# Patient Record
Sex: Male | Born: 1970 | Race: Black or African American | Hispanic: No | Smoking: Former smoker
Health system: Southern US, Community
[De-identification: ages and names within clinical notes are randomized; demographics above are authoritative.]

## PROBLEM LIST (undated history)

## (undated) DIAGNOSIS — M109 Gout, unspecified: Secondary | ICD-10-CM

## (undated) DIAGNOSIS — I10 Essential (primary) hypertension: Secondary | ICD-10-CM

## (undated) DIAGNOSIS — N200 Calculus of kidney: Secondary | ICD-10-CM

## (undated) HISTORY — PX: ANKLE FUSION: SHX881

## (undated) HISTORY — PX: TONSILLECTOMY: SUR1361

---

## 2004-02-28 ENCOUNTER — Other Ambulatory Visit: Payer: Self-pay

## 2005-10-30 ENCOUNTER — Ambulatory Visit: Payer: Self-pay

## 2009-10-02 ENCOUNTER — Emergency Department: Payer: Self-pay

## 2012-12-28 ENCOUNTER — Emergency Department: Payer: Self-pay | Admitting: Emergency Medicine

## 2012-12-28 LAB — CBC
HCT: 46.3 % (ref 40.0–52.0)
MCH: 25.3 pg — ABNORMAL LOW (ref 26.0–34.0)
MCHC: 32.5 g/dL (ref 32.0–36.0)
MCV: 78 fL — ABNORMAL LOW (ref 80–100)
Platelet: 291 10*3/uL (ref 150–440)
RBC: 5.93 10*6/uL — ABNORMAL HIGH (ref 4.40–5.90)
RDW: 14.7 % — ABNORMAL HIGH (ref 11.5–14.5)
WBC: 12.8 10*3/uL — ABNORMAL HIGH (ref 3.8–10.6)

## 2012-12-28 LAB — URINALYSIS, COMPLETE
Bacteria: NONE SEEN
Bilirubin,UR: NEGATIVE
Protein: 30
RBC,UR: 184 /HPF (ref 0–5)
Squamous Epithelial: <1
WBC UR: 14 /HPF (ref 0–5)

## 2012-12-28 LAB — BASIC METABOLIC PANEL
Anion Gap: 7 (ref 7–16)
Chloride: 106 mmol/L (ref 98–107)
EGFR (Non-African Amer.): >60
Glucose: 106 mg/dL — ABNORMAL HIGH (ref 65–99)
Osmolality: 276 (ref 275–301)
Potassium: 4 mmol/L (ref 3.5–5.1)
Sodium: 138 mmol/L (ref 136–145)

## 2013-05-16 ENCOUNTER — Emergency Department: Payer: Self-pay | Admitting: Emergency Medicine

## 2013-06-25 ENCOUNTER — Emergency Department: Payer: Self-pay | Admitting: Emergency Medicine

## 2013-06-25 LAB — BASIC METABOLIC PANEL
Anion Gap: 4 — ABNORMAL LOW (ref 7–16)
BUN: 11 mg/dL (ref 7–18)
Calcium, Total: 9.1 mg/dL (ref 8.5–10.1)
Chloride: 104 mmol/L (ref 98–107)
EGFR (African American): >60
EGFR (Non-African Amer.): >60
Glucose: 97 mg/dL (ref 65–99)
Osmolality: 275 (ref 275–301)
Potassium: 3.8 mmol/L (ref 3.5–5.1)

## 2013-06-25 LAB — CBC WITH DIFFERENTIAL/PLATELET
Basophil %: 1.5 %
Eosinophil #: 0.1 10*3/uL (ref 0.0–0.7)
Eosinophil %: 0.7 %
HGB: 15.3 g/dL (ref 13.0–18.0)
Lymphocyte #: 1.9 10*3/uL (ref 1.0–3.6)
Lymphocyte %: 16.7 %
MCH: 25.9 pg — ABNORMAL LOW (ref 26.0–34.0)
MCHC: 33.2 g/dL (ref 32.0–36.0)
Monocyte #: 1 x10 3/mm (ref 0.2–1.0)
Monocyte %: 8.8 %
Neutrophil #: 8.2 10*3/uL — ABNORMAL HIGH (ref 1.4–6.5)
RBC: 5.92 10*6/uL — ABNORMAL HIGH (ref 4.40–5.90)
WBC: 11.3 10*3/uL — ABNORMAL HIGH (ref 3.8–10.6)

## 2013-06-25 LAB — URIC ACID: Uric Acid: 7.3 mg/dL — ABNORMAL HIGH (ref 3.5–7.2)

## 2014-06-23 ENCOUNTER — Emergency Department: Payer: Self-pay | Admitting: Emergency Medicine

## 2014-06-23 LAB — URINALYSIS, COMPLETE
BACTERIA: NONE SEEN
Bilirubin,UR: NEGATIVE
Blood: NEGATIVE
Glucose,UR: NEGATIVE mg/dL (ref 0–75)
KETONE: NEGATIVE
LEUKOCYTE ESTERASE: NEGATIVE
Nitrite: NEGATIVE
Ph: 6 (ref 4.5–8.0)
Protein: NEGATIVE
RBC,UR: 2 /HPF (ref 0–5)
Specific Gravity: 1.019 (ref 1.003–1.030)
Squamous Epithelial: NONE SEEN

## 2014-06-23 LAB — COMPREHENSIVE METABOLIC PANEL
Albumin: 3.9 g/dL (ref 3.4–5.0)
Alkaline Phosphatase: 90 U/L
Anion Gap: 7 (ref 7–16)
BILIRUBIN TOTAL: 0.3 mg/dL (ref 0.2–1.0)
BUN: 11 mg/dL (ref 7–18)
CREATININE: 0.9 mg/dL (ref 0.60–1.30)
Calcium, Total: 9 mg/dL (ref 8.5–10.1)
Chloride: 107 mmol/L (ref 98–107)
Co2: 27 mmol/L (ref 21–32)
EGFR (African American): >60
EGFR (Non-African Amer.): >60
GLUCOSE: 93 mg/dL (ref 65–99)
Osmolality: 280 (ref 275–301)
Potassium: 4.1 mmol/L (ref 3.5–5.1)
SGOT(AST): 27 U/L (ref 15–37)
SGPT (ALT): 29 U/L
Sodium: 141 mmol/L (ref 136–145)
TOTAL PROTEIN: 7.7 g/dL (ref 6.4–8.2)

## 2014-06-23 LAB — CBC
HCT: 48.5 % (ref 40.0–52.0)
HGB: 15.3 g/dL (ref 13.0–18.0)
MCH: 25.7 pg — AB (ref 26.0–34.0)
MCHC: 31.6 g/dL — ABNORMAL LOW (ref 32.0–36.0)
MCV: 81 fL (ref 80–100)
PLATELETS: 266 10*3/uL (ref 150–440)
RBC: 5.96 10*6/uL — ABNORMAL HIGH (ref 4.40–5.90)
RDW: 14.6 % — AB (ref 11.5–14.5)
WBC: 8.1 10*3/uL (ref 3.8–10.6)

## 2014-06-23 LAB — LIPASE, BLOOD: LIPASE: 212 U/L (ref 73–393)

## 2014-06-23 LAB — TROPONIN I: Troponin-I: <0.02 ng/mL

## 2014-07-13 ENCOUNTER — Ambulatory Visit: Payer: Self-pay | Admitting: Urology

## 2014-08-25 ENCOUNTER — Ambulatory Visit: Payer: Self-pay | Admitting: Urology

## 2015-06-16 ENCOUNTER — Emergency Department
Admission: EM | Admit: 2015-06-16 | Discharge: 2015-06-16 | Disposition: A | Discharge status: Home / Self Care | Payer: Medicare Other | Attending: Emergency Medicine | Admitting: Emergency Medicine

## 2015-06-16 ENCOUNTER — Encounter: Payer: Self-pay | Admitting: Emergency Medicine

## 2015-06-16 DIAGNOSIS — M10031 Idiopathic gout, right wrist: Secondary | ICD-10-CM | POA: Insufficient documentation

## 2015-06-16 DIAGNOSIS — M109 Gout, unspecified: Secondary | ICD-10-CM

## 2015-06-16 DIAGNOSIS — I10 Essential (primary) hypertension: Secondary | ICD-10-CM | POA: Diagnosis not present

## 2015-06-16 DIAGNOSIS — Z87891 Personal history of nicotine dependence: Secondary | ICD-10-CM | POA: Insufficient documentation

## 2015-06-16 DIAGNOSIS — M25531 Pain in right wrist: Secondary | ICD-10-CM | POA: Diagnosis present

## 2015-06-16 HISTORY — DX: Essential (primary) hypertension: I10

## 2015-06-16 HISTORY — DX: Gout, unspecified: M10.9

## 2015-06-16 MED ORDER — PREDNISONE 10 MG PO TABS
ORAL_TABLET | ORAL | Status: DC
Start: 2015-06-16 — End: 2015-08-30

## 2015-06-16 MED ORDER — OXYCODONE-ACETAMINOPHEN 5-325 MG PO TABS: 1.0000 | ORAL_TABLET | ORAL | Status: DC | PRN

## 2015-06-16 MED ORDER — DEXAMETHASONE SODIUM PHOSPHATE 10 MG/ML IJ SOLN
10.0000 mg | Freq: Once | INTRAMUSCULAR | Status: AC
  Administered 2015-06-16: 10 mg via INTRAMUSCULAR
  Filled 2015-06-16: qty 1

## 2015-06-16 MED ORDER — OXYCODONE-ACETAMINOPHEN 5-325 MG PO TABS
2.0000 | ORAL_TABLET | Freq: Once | ORAL | Status: AC
  Administered 2015-06-16: 2 via ORAL
  Filled 2015-06-16: qty 2

## 2015-06-16 NOTE — ED Provider Notes (Signed)
[facility] Emergency Department Provider Note  ____________________________________________  Time seen:  2:39 PM  I have reviewed the triage vital signs and the nursing notes.   HISTORY  Chief Complaint Wrist Pain   HPI Bernard Perez is a 44 y.o. male is here today with complaint of acute gout flareup in his right arm. He states he has a history of gout and has been taking colchicine for his gout for 2 days howeverthis is upset his stomach and he has had to quit. He has had gout in his right third finger multiple times however this is in his wrist this time. He denies any recent injury to his arm and this feels exactly like his gout. Currently his pain is 10 out of 10.  Pain is constant and nonradiating. Also patient is hypertensive in the emergency room but admits he did not take his blood pressure medicine today.   Past Medical History  Diagnosis Date  . Gout   . Hypertension     There are no active problems to display for this patient.   Past Surgical History  Procedure Laterality Date  . Ankle fusion    . Tonsillectomy      No current outpatient prescriptions on file.  Allergies Review of patient's allergies indicates no known allergies.  No family history on file.  Social History History  Substance Use Topics  . Smoking status: Former Games developer  . Smokeless tobacco: Not on file  . Alcohol Use: No    Review of Systems Constitutional: No fever/chills ENT: No sore throat. Cardiovascular: Denies chest pain. Respiratory: Denies shortness of breath. Gastrointestinal: GI upset  positive nausea, no vomiting.  No diarrhea.  No constipation. Genitourinary: Negative for dysuria. Musculoskeletal: Negative for back pain. Skin: Negative for rash. Red and  warm right arm and hand Neurological: Negative for headaches, focal weakness or numbness.  10-point ROS otherwise negative.  ____________________________________________   PHYSICAL  EXAM:  VITAL SIGNS: ED Triage Vitals  Enc Vitals Group     BP 06/16/15 1246 204/115 mmHg     Pulse Rate 06/16/15 1246 64     Resp 06/16/15 1246 18     Temp 06/16/15 1246 98.2 F (36.8 C)     Temp Source 06/16/15 1246 Oral     SpO2 06/16/15 1246 100 %     Weight 06/16/15 1244 265 lb (120.203 kg)     Height 06/16/15 1244 6\' 1"  (1.854 m)     Head Cir --      Peak Flow --      Pain Score 06/16/15 1244 10     Pain Loc --      Pain Edu? --      Excl. in GC? --     Constitutional: Alert and oriented. Well appearing and in no acute distress. Eyes: Conjunctivae are normal. PERRL. EOMI. Head: Atraumatic. Nose: No congestion/rhinnorhea. Mouth/Throat: Mucous membranes are moist.. Neck: No stridor.   Cardiovascular: Normal rate, regular rhythm. Grossly normal heart sounds.  Good peripheral circulation. Respiratory: Normal respiratory effort.  No retractions. Lungs CTAB. Gastrointestinal: Soft and nontender. No distention. Musculoskeletal: Right wrist is moderately tender to palpation, edematous and red. Range of motion is restricted secondary to pain.  No lower extremity tenderness nor edema.  No joint effusions. Neurologic:  Normal speech and language. No gross focal neurologic deficits are appreciated. No gait instability. Skin:  Right wrist and hand as stated above. Psychiatric: Mood and affect are normal. Speech and behavior are normal.  ____________________________________________   LABS (all labs ordered are listed, but only abnormal results are displayed)  Labs Reviewed - No data to display PROCEDURES  Procedure(s) performed: None  Critical Care performed: No  ____________________________________________   INITIAL IMPRESSION / ASSESSMENT AND PLAN / ED COURSE  Pertinent labs & imaging results that were available during my care of the patient were reviewed by me and considered in my medical decision making (see chart for details).  Patient was given Decadron IM along  with Percocet while he was in the emergency room. He was given a sling for elevation. He is started on Sterapred DS pack for 6 days and continued on Percocet. He will follow-up with his doctor if any continued problems with his gout. He will resume his blood pressure medication and take daily. ____________________________________________   FINAL CLINICAL IMPRESSION(S) / ED DIAGNOSES  Final diagnoses:  Acute gouty arthritis      Tommi Rumps, PA-C 06/16/15 1530  Tommi Rumps, PA-C 06/16/15 1537  Darien Ramus, MD 06/16/15 1537

## 2015-06-16 NOTE — ED Notes (Signed)
Pt states gout flare up in his right arm, pt states hx of gout and on gout medications, pt in no distress

## 2015-06-16 NOTE — Discharge Instructions (Signed)
Gout Gout is when your joints become red, sore, and swell (inflamed). This is caused by the buildup of uric acid crystals in the joints. Uric acid is a chemical that is normally in the blood. If the level of uric acid gets too high in the blood, these crystals form in your joints and tissues. Over time, these crystals can form into masses near the joints and tissues. These masses can destroy bone and cause the bone to look misshapen (deformed). HOME CARE   Do not take aspirin for pain.  Only take medicine as told by your doctor.  Rest the joint as much as you can. When in bed, keep sheets and blankets off painful areas.  Keep the sore joints raised (elevated).  Put warm or cold packs on painful joints. Use of warm or cold packs depends on which works best for you.  Use crutches if the painful joint is in your leg.  Drink enough fluids to keep your pee (urine) clear or pale yellow. Limit alcohol, sugary drinks, and drinks with fructose in them.  Follow your diet instructions. Pay careful attention to how much protein you eat. Include fruits, vegetables, whole grains, and fat-free or low-fat milk products in your daily diet. Talk to your doctor or dietitian about the use of coffee, vitamin C, and cherries. These may help lower uric acid levels.  Keep a healthy body weight. GET HELP RIGHT AWAY IF:   You have watery poop (diarrhea), throw up (vomit), or have any side effects from medicines.  You do not feel better in 24 hours, or you are getting worse.  Your joint becomes suddenly more tender, and you have chills or a fever. MAKE SURE YOU:   Understand these instructions.  Will watch your condition.  Will get help right away if you are not doing well or get worse. Document Released: 08/07/2008 Document Revised: 03/15/2014 Document Reviewed: 06/11/2012 [facility] Patient Information 2015 [place], [place]. This information is not intended to replace advice given to you by your health care  provider. Make sure you discuss any questions you have with your health care provider.  Gout Gout is when your joints become red, sore, and swell (inflamed). This is caused by the buildup of uric acid crystals in the joints. Uric acid is a chemical that is normally in the blood. If the level of uric acid gets too high in the blood, these crystals form in your joints and tissues. Over time, these crystals can form into masses near the joints and tissues. These masses can destroy bone and cause the bone to look misshapen (deformed). HOME CARE   Do not take aspirin for pain.  Only take medicine as told by your doctor.  Rest the joint as much as you can. When in bed, keep sheets and blankets off painful areas.  Keep the sore joints raised (elevated).  Put warm or cold packs on painful joints. Use of warm or cold packs depends on which works best for you.  Use crutches if the painful joint is in your leg.  Drink enough fluids to keep your pee (urine) clear or pale yellow. Limit alcohol, sugary drinks, and drinks with fructose in them.  Follow your diet instructions. Pay careful attention to how much protein you eat. Include fruits, vegetables, whole grains, and fat-free or low-fat milk products in your daily diet. Talk to your doctor or dietitian about the use of coffee, vitamin C, and cherries. These may help lower uric acid levels.  Keep  a healthy body weight. GET HELP RIGHT AWAY IF:   You have watery poop (diarrhea), throw up (vomit), or have any side effects from medicines.  You do not feel better in 24 hours, or you are getting worse.  Your joint becomes suddenly more tender, and you have chills or a fever. MAKE SURE YOU:   Understand these instructions.  Will watch your condition.  Will get help right away if you are not doing well or get worse. Document Released: 08/07/2008 Document Revised: 03/15/2014 Document Reviewed: 06/11/2012 [facility] Patient Information 2015 [place],  [place]. This information is not intended to replace advice given to you by your health care provider. Make sure you discuss any questions you have with your health care provider.  Low-Purine Diet Purines are compounds that affect the level of uric acid in your body. A low-purine diet is a diet that is low in purines. Eating a low-purine diet can prevent the level of uric acid in your body from getting too high and causing gout or kidney stones or both. WHAT DO I NEED TO KNOW ABOUT THIS DIET?  Choose low-purine foods. Examples of low-purine foods are listed in the next section.  Drink plenty of fluids, especially water. Fluids can help remove uric acid from your body. Try to drink 8-16 cups (1.9-3.8 L) a day.  Limit foods high in fat, especially saturated fat, as fat makes it harder for the body to get rid of uric acid. Foods high in saturated fat include pizza, cheese, ice cream, whole milk, fried foods, and gravies. Choose foods that are lower in fat and lean sources of protein. Use olive oil when cooking as it contains healthy fats that are not high in saturated fat.  Limit alcohol. Alcohol interferes with the elimination of uric acid from your body. If you are having a gout attack, avoid all alcohol.  Keep in mind that different people's bodies react differently to different foods. You will probably learn over time which foods do or do not affect you. If you discover that a food tends to cause your gout to flare up, avoid eating that food. You can more freely enjoy foods that do not cause problems. If you have any questions about a food item, talk to your dietitian or health care provider. WHICH FOODS ARE LOW, MODERATE, AND HIGH IN PURINES? The following is a list of foods that are low, moderate, and high in purines. You can eat any amount of the foods that are low in purines. You may be able to have small amounts of foods that are moderate in purines. Ask your health care provider how much of a food  moderate in purines you can have. Avoid foods high in purines. Grains  Foods low in purines: Enriched white bread, pasta, rice, cake, cornbread, popcorn.  Foods moderate in purines: Whole-grain breads and cereals, wheat germ, bran, oatmeal. Uncooked oatmeal. Dry wheat bran or wheat germ.  Foods high in purines: Pancakes, [place] toast, biscuits, muffins. Vegetables  Foods low in purines: All vegetables, except those that are moderate in purines.  Foods moderate in purines: Asparagus, cauliflower, spinach, mushrooms, green peas. Fruits  All fruits are low in purines. Meats and other Protein Foods  Foods low in purines: Eggs, nuts, peanut butter.  Foods moderate in purines: 80-90% lean beef, lamb, veal, pork, poultry, fish, eggs, peanut butter, nuts. Crab, lobster, oysters, and shrimp. Cooked dried beans, peas, and lentils.  Foods high in purines: Anchovies, sardines, herring, mussels, tuna, codfish, scallops,  trout, and haddock. Tomasa Blase. Organ meats (such as liver or kidney). Tripe. Game meat. Goose. Sweetbreads. Dairy  All dairy foods are low in purines. Low-fat and fat-free dairy products are best because they are low in saturated fat. Beverages  Drinks low in purines: Water, carbonated beverages, tea, coffee, cocoa.  Drinks moderate in purines: Soft drinks and other drinks sweetened with high-fructose corn syrup. Juices. To find whether a food or drink is sweetened with high-fructose corn syrup, look at the ingredients list.  Drinks high in purines: Alcoholic beverages (such as beer). Condiments  Foods low in purines: Salt, herbs, olives, pickles, relishes, vinegar.  Foods moderate in purines: Butter, margarine, oils, mayonnaise. Fats and Oils  Foods low in purines: All types, except gravies and sauces made with meat.  Foods high in purines: Gravies and sauces made with meat. Other Foods  Foods low in purines: Sugars, sweets, gelatin. Cake. Soups made without  meat.  Foods moderate in purines: Meat-based or fish-based soups, broths, or bouillons. Foods and drinks sweetened with high-fructose corn syrup.  Foods high in purines: High-fat desserts (such as ice cream, cookies, cakes, pies, doughnuts, and chocolate). Contact your dietitian for more information on foods that are not listed here. Document Released: 02/23/2011 Document Revised: 11/03/2013 Document Reviewed: 10/05/2013 [facility] Patient Information 2015 [place], [place]. This information is not intended to replace advice given to you by your health care provider. Make sure you discuss any questions you have with your health care provider.

## 2015-06-16 NOTE — ED Notes (Signed)
Pt here with c/o gout in his right wrist. Area appears swollen.

## 2015-08-30 ENCOUNTER — Encounter: Payer: Self-pay | Admitting: Emergency Medicine

## 2015-08-30 ENCOUNTER — Emergency Department
Admission: EM | Admit: 2015-08-30 | Discharge: 2015-08-30 | Disposition: A | Discharge status: Home / Self Care | Payer: Medicare Other | Attending: Emergency Medicine | Admitting: Emergency Medicine

## 2015-08-30 DIAGNOSIS — M10079 Idiopathic gout, unspecified ankle and foot: Secondary | ICD-10-CM | POA: Diagnosis not present

## 2015-08-30 DIAGNOSIS — M79671 Pain in right foot: Secondary | ICD-10-CM | POA: Diagnosis present

## 2015-08-30 DIAGNOSIS — Z87891 Personal history of nicotine dependence: Secondary | ICD-10-CM | POA: Insufficient documentation

## 2015-08-30 DIAGNOSIS — M109 Gout, unspecified: Secondary | ICD-10-CM

## 2015-08-30 DIAGNOSIS — R109 Unspecified abdominal pain: Secondary | ICD-10-CM | POA: Insufficient documentation

## 2015-08-30 DIAGNOSIS — I10 Essential (primary) hypertension: Secondary | ICD-10-CM | POA: Diagnosis not present

## 2015-08-30 HISTORY — DX: Calculus of kidney: N20.0

## 2015-08-30 LAB — CBC WITH DIFFERENTIAL/PLATELET
BASOS PCT: 1 %
Basophils Absolute: 0.1 10*3/uL (ref 0–0.1)
EOS ABS: 0 10*3/uL (ref 0–0.7)
EOS PCT: 0 %
HCT: 49.7 % (ref 40.0–52.0)
HEMOGLOBIN: 15.8 g/dL (ref 13.0–18.0)
Lymphocytes Relative: 6 %
Lymphs Abs: 0.8 10*3/uL — ABNORMAL LOW (ref 1.0–3.6)
MCH: 25.2 pg — ABNORMAL LOW (ref 26.0–34.0)
MCHC: 31.9 g/dL — AB (ref 32.0–36.0)
MCV: 79 fL — ABNORMAL LOW (ref 80.0–100.0)
MONOS PCT: 7 %
Monocytes Absolute: 0.9 10*3/uL (ref 0.2–1.0)
NEUTROS PCT: 86 %
Neutro Abs: 11 10*3/uL — ABNORMAL HIGH (ref 1.4–6.5)
PLATELETS: 372 10*3/uL (ref 150–440)
RBC: 6.29 MIL/uL — ABNORMAL HIGH (ref 4.40–5.90)
RDW: 15 % — ABNORMAL HIGH (ref 11.5–14.5)
WBC: 12.8 10*3/uL — ABNORMAL HIGH (ref 3.8–10.6)

## 2015-08-30 LAB — BASIC METABOLIC PANEL
Anion gap: 9 (ref 5–15)
BUN: 16 mg/dL (ref 6–20)
CALCIUM: 9.6 mg/dL (ref 8.9–10.3)
CHLORIDE: 101 mmol/L (ref 101–111)
CO2: 25 mmol/L (ref 22–32)
CREATININE: 1.03 mg/dL (ref 0.61–1.24)
Glucose, Bld: 111 mg/dL — ABNORMAL HIGH (ref 65–99)
Potassium: 4.1 mmol/L (ref 3.5–5.1)
SODIUM: 135 mmol/L (ref 135–145)

## 2015-08-30 LAB — URINALYSIS COMPLETE WITH MICROSCOPIC (ARMC ONLY)
Bacteria, UA: NONE SEEN
Bilirubin Urine: NEGATIVE
Glucose, UA: NEGATIVE mg/dL
KETONES UR: NEGATIVE mg/dL
Leukocytes, UA: NEGATIVE
Nitrite: NEGATIVE
PROTEIN: NEGATIVE mg/dL
Specific Gravity, Urine: 1.016 (ref 1.005–1.030)
pH: 5 (ref 5.0–8.0)

## 2015-08-30 LAB — URIC ACID: Uric Acid, Serum: 8 mg/dL — ABNORMAL HIGH (ref 4.4–7.6)

## 2015-08-30 MED ORDER — SODIUM CHLORIDE 0.9 % IV BOLUS (SEPSIS)
1000.0000 mL | Freq: Once | INTRAVENOUS | Status: AC
  Administered 2015-08-30: 1000 mL via INTRAVENOUS

## 2015-08-30 MED ORDER — ONDANSETRON HCL 4 MG PO TABS: 4.0000 mg | ORAL_TABLET | Freq: Every day | ORAL | Status: DC | PRN

## 2015-08-30 MED ORDER — OXYCODONE-ACETAMINOPHEN 5-325 MG PO TABS: 1.0000 | ORAL_TABLET | ORAL | Status: DC | PRN

## 2015-08-30 MED ORDER — MORPHINE SULFATE (PF) 4 MG/ML IV SOLN
4.0000 mg | Freq: Once | INTRAVENOUS | Status: AC
Start: 2015-08-30 — End: 2015-08-30
  Administered 2015-08-30: 4 mg via INTRAVENOUS
  Filled 2015-08-30: qty 1

## 2015-08-30 MED ORDER — TAMSULOSIN HCL 0.4 MG PO CAPS: 0.4000 mg | ORAL_CAPSULE | Freq: Every day | ORAL | Status: AC

## 2015-08-30 MED ORDER — METHYLPREDNISOLONE SODIUM SUCC 125 MG IJ SOLR
125.0000 mg | Freq: Once | INTRAMUSCULAR | Status: AC
  Administered 2015-08-30: 125 mg via INTRAVENOUS
  Filled 2015-08-30 (×2): qty 2

## 2015-08-30 MED ORDER — PREDNISONE 10 MG PO TABS: ORAL_TABLET | ORAL | Status: DC

## 2015-08-30 MED ORDER — ONDANSETRON HCL 4 MG/2ML IJ SOLN
4.0000 mg | Freq: Once | INTRAMUSCULAR | Status: AC
  Administered 2015-08-30: 4 mg via INTRAVENOUS
  Filled 2015-08-30: qty 2

## 2015-08-30 NOTE — ED Notes (Signed)
Has history of Gout and gout attack since Thursday.  C/o pain to right knee and both ankles.  Also c/o lower back pain and right flank pain.  Patient states pain is like when he has kidney stones.  Pain started Sunday.

## 2015-08-30 NOTE — Discharge Instructions (Signed)
Please seek medical attention for any high fevers, chest pain, shortness of breath, change in behavior, persistent vomiting, bloody stool or any other new or concerning symptoms.   Gout Gout is an inflammatory arthritis caused by a buildup of uric acid crystals in the joints. Uric acid is a chemical that is normally present in the blood. When the level of uric acid in the blood is too high it can form crystals that deposit in your joints and tissues. This causes joint redness, soreness, and swelling (inflammation). Repeat attacks are common. Over time, uric acid crystals can form into masses (tophi) near a joint, destroying bone and causing disfigurement. Gout is treatable and often preventable. CAUSES  The disease begins with elevated levels of uric acid in the blood. Uric acid is produced by your body when it breaks down a naturally found substance called purines. Certain foods you eat, such as meats and fish, contain high amounts of purines. Causes of an elevated uric acid level include:  Being passed down from parent to child (heredity).  Diseases that cause increased uric acid production (such as obesity, psoriasis, and certain cancers).  Excessive alcohol use.  Diet, especially diets rich in meat and seafood.  Medicines, including certain cancer-fighting medicines (chemotherapy), water pills (diuretics), and aspirin.  Chronic kidney disease. The kidneys are no longer able to remove uric acid well.  Problems with metabolism. Conditions strongly associated with gout include:  Obesity.  High blood pressure.  High cholesterol.  Diabetes. Not everyone with elevated uric acid levels gets gout. It is not understood why some people get gout and others do not. Surgery, joint injury, and eating too much of certain foods are some of the factors that can lead to gout attacks. SYMPTOMS   An attack of gout comes on quickly. It causes intense pain with redness, swelling, and warmth in a  joint.  Fever can occur.  Often, only one joint is involved. Certain joints are more commonly involved:  Base of the big toe.  Knee.  Ankle.  Wrist.  Finger. Without treatment, an attack usually goes away in a few days to weeks. Between attacks, you usually will not have symptoms, which is different from many other forms of arthritis. DIAGNOSIS  Your caregiver will suspect gout based on your symptoms and exam. In some cases, tests may be recommended. The tests may include:  Blood tests.  Urine tests.  X-rays.  Joint fluid exam. This exam requires a needle to remove fluid from the joint (arthrocentesis). Using a microscope, gout is confirmed when uric acid crystals are seen in the joint fluid. TREATMENT  There are two phases to gout treatment: treating the sudden onset (acute) attack and preventing attacks (prophylaxis).  Treatment of an Acute Attack.  Medicines are used. These include anti-inflammatory medicines or steroid medicines.  An injection of steroid medicine into the affected joint is sometimes necessary.  The painful joint is rested. Movement can worsen the arthritis.  You may use warm or cold treatments on painful joints, depending which works best for you.  Treatment to Prevent Attacks.  If you suffer from frequent gout attacks, your caregiver may advise preventive medicine. These medicines are started after the acute attack subsides. These medicines either help your kidneys eliminate uric acid from your body or decrease your uric acid production. You may need to stay on these medicines for a very long time.  The early phase of treatment with preventive medicine can be associated with an increase in acute gout  attacks. For this reason, during the first few months of treatment, your caregiver may also advise you to take medicines usually used for acute gout treatment. Be sure you understand your caregiver's directions. Your caregiver may make several adjustments  to your medicine dose before these medicines are effective.  Discuss dietary treatment with your caregiver or dietitian. Alcohol and drinks high in sugar and fructose and foods such as meat, poultry, and seafood can increase uric acid levels. Your caregiver or dietitian can advise you on drinks and foods that should be limited. HOME CARE INSTRUCTIONS   Do not take aspirin to relieve pain. This raises uric acid levels.  Only take over-the-counter or prescription medicines for pain, discomfort, or fever as directed by your caregiver.  Rest the joint as much as possible. When in bed, keep sheets and blankets off painful areas.  Keep the affected joint raised (elevated).  Apply warm or cold treatments to painful joints. Use of warm or cold treatments depends on which works best for you.  Use crutches if the painful joint is in your leg.  Drink enough fluids to keep your urine clear or pale yellow. This helps your body get rid of uric acid. Limit alcohol, sugary drinks, and fructose drinks.  Follow your dietary instructions. Pay careful attention to the amount of protein you eat. Your daily diet should emphasize fruits, vegetables, whole grains, and fat-free or low-fat milk products. Discuss the use of coffee, vitamin C, and cherries with your caregiver or dietitian. These may be helpful in lowering uric acid levels.  Maintain a healthy body weight. SEEK MEDICAL CARE IF:   You develop diarrhea, vomiting, or any side effects from medicines.  You do not feel better in 24 hours, or you are getting worse. SEEK IMMEDIATE MEDICAL CARE IF:   Your joint becomes suddenly more tender, and you have chills or a fever. MAKE SURE YOU:   Understand these instructions.  Will watch your condition.  Will get help right away if you are not doing well or get worse.   This information is not intended to replace advice given to you by your health care provider. Make sure you discuss any questions you have  with your health care provider.   Document Released: 10/26/2000 Document Revised: 11/19/2014 Document Reviewed: 06/11/2012 [employer] Interactive Patient Education [employer]

## 2015-08-30 NOTE — ED Notes (Signed)

## 2015-08-30 NOTE — ED Provider Notes (Signed)
[facility] Emergency Department Provider Note    ____________________________________________  Time seen: 1750 I have reviewed the triage vital signs and the nursing notes.   HISTORY  Chief Complaint Flank Pain and Extremity Pain   History limited by: Not Limited   HPI DELONTAE LAMM is a 44 y.o. male who presents to the emergency department today with concerns for bilateral foot and right knee pain. Patient states he does have a history of gout. He states that it does feel like a gout flare. It started with pain in his left great toe and he developed in his right toe on his right knee. He states he has had gout in his knees in the past. He states he has had it diagnosed through a fluid analysis. He states he thinks that he triggered by overexerting himself after a recent weather store when he was cutting down a tree and cleaning up the yard. In addition the patient states that he has had some right flank pain. He states that he will commonly get kidney stones when he gets his acute gouty flares. He has had some episodes of diaphoresis and fevers.    Past Medical History  Diagnosis Date  . Gout   . Hypertension   . Kidney stones     There are no active problems to display for this patient.   Past Surgical History  Procedure Laterality Date  . Ankle fusion    . Tonsillectomy      Current Outpatient Rx  Name  Route  Sig  Dispense  Refill  . oxyCODONE-acetaminophen (PERCOCET) 5-325 MG per tablet   Oral   Take 1 tablet by mouth every 4 (four) hours as needed for severe pain.   20 tablet   0   . predniSONE (DELTASONE) 10 MG tablet      Take 6 tablets  today, on day 2 take 5 tablets, day 3 take 4 tablets, day 4 take 3 tablets, day 5 take  2 tablets and 1 tablet the last day   21 tablet   0     Allergies Review of patient's allergies indicates no known allergies.  No family history on file.  Social History Social History  Substance Use  Topics  . Smoking status: Former Games developer  . Smokeless tobacco: Not on file  . Alcohol Use: No    Review of Systems  Constitutional: Negative for fever. Cardiovascular: Negative for chest pain. Respiratory: Negative for shortness of breath. Gastrointestinal: Negative for abdominal pain, vomiting and diarrhea. Genitourinary: Negative for dysuria. Musculoskeletal: Right flank/back pain. Bilateral foot pain, right knee pain. Skin: Negative for rash. Neurological: Negative for headaches, focal weakness or numbness.   10-point ROS otherwise negative.  ____________________________________________   PHYSICAL EXAM:  VITAL SIGNS: ED Triage Vitals  Enc Vitals Group     BP 08/30/15 1647 215/116 mmHg     Pulse Rate 08/30/15 1647 90     Resp 08/30/15 1647 16     Temp 08/30/15 1647 99.4 F (37.4 C)     Temp Source 08/30/15 1647 Oral     SpO2 08/30/15 1647 100 %     Weight 08/30/15 1647 276 lb (125.193 kg)     Height 08/30/15 1647  (1.854 m)     Head Cir --      Peak Flow --      Pain Score 08/30/15 1655 10   Constitutional: Alert and oriented. Appears uncomfortable. Eyes: Conjunctivae are normal. PERRL. Normal extraocular movements.  ENT   Head: Normocephalic and atraumatic.   Nose: No congestion/rhinnorhea.   Mouth/Throat: Mucous membranes are moist.   Neck: No stridor. Hematological/Lymphatic/Immunilogical: No cervical lymphadenopathy. Cardiovascular: Normal rate, regular rhythm.  No murmurs, rubs, or gallops. Respiratory: Normal respiratory effort without tachypnea nor retractions. Breath sounds are clear and equal bilaterally. No wheezes/rales/rhonchi. Gastrointestinal: Soft and nontender. No distention.  Genitourinary: Deferred Musculoskeletal: Bilateral great toes with erythema, swelling and tenderness. Right knee with some swelling and tenderness.  Neurologic:  Normal speech and language. No gross focal neurologic deficits are appreciated. Speech is  normal.  Skin:  Skin is warm, dry and intact. No rash noted. Psychiatric: Mood and affect are normal. Speech and behavior are normal. Patient exhibits appropriate insight and judgment.  ____________________________________________    LABS (pertinent positives/negatives)  Labs Reviewed  URINALYSIS COMPLETEWITH MICROSCOPIC ([facility] ONLY) - Abnormal; Notable for the following:    Color, Urine YELLOW (*)    APPearance CLEAR (*)    Hgb urine dipstick 2+ (*)    Squamous Epithelial / LPF 0-5 (*)    All other components within normal limits  URIC ACID - Abnormal; Notable for the following:    Uric Acid, Serum 8.0 (*)    All other components within normal limits  CBC WITH DIFFERENTIAL/PLATELET - Abnormal; Notable for the following:    WBC 12.8 (*)    RBC 6.29 (*)    MCV 79.0 (*)    MCH 25.2 (*)    MCHC 31.9 (*)    RDW 15.0 (*)    Neutro Abs 11.0 (*)    Lymphs Abs 0.8 (*)    All other components within normal limits  BASIC METABOLIC PANEL - Abnormal; Notable for the following:    Glucose, Bld 111 (*)    All other components within normal limits     ____________________________________________   EKG  None  ____________________________________________    RADIOLOGY  None   ____________________________________________   PROCEDURES  Procedure(s) performed: None  Critical Care performed: No  ____________________________________________   INITIAL IMPRESSION / ASSESSMENT AND PLAN / ED COURSE  Pertinent labs & imaging results that were available during my care of the patient were reviewed by me and considered in my medical decision making (see chart for details).  Patient presents to the emergency department today because of concerns for bilateral foot and right knee pain. The patient does state that this reminds me of his previous gout flares. On exam his bilateral great toes are consistent with a gouty attack. Additionally his knee is consistent with a gouty attack. At  this point I doubt septic arthritis given lack of fever. In addition patient does have some confirm for kidney stones stating that he has gotten them when he has had his gouty attacks in the past. His urine was consistent with a kidney stone with red blood cells. Will plan on discharging home with a medication, antiemetics, Flomax. Will also give patient a long steroid taper for the gout.  ____________________________________________   FINAL CLINICAL IMPRESSION(S) / ED DIAGNOSES  Final diagnoses:  Acute gout of foot, unspecified cause, unspecified laterality     Phineas SemenGraydon Vanda Waskey, MD 08/30/15 2120

## 2015-08-30 NOTE — ED Notes (Signed)
Dr. Derrill KayGoodman notified of pt blood pressure, pt reports has not taken BP medicine in 2 days. Dr. Derrill KayGoodman states okay for pt to be discharge with elevated BP. Informed pt to take blood pressure, pt verbalized understanding.

## 2015-09-01 IMAGING — CT CT STONE STUDY
2 of 4 series · 17 of 46 positions shown, 19 images · non-contrast
Comparison: 12/28/2012.

CLINICAL DATA: Bilateral flank pain with history of kidney stones.
Left flank pain.

EXAM:
CT ABDOMEN AND PELVIS WITHOUT CONTRAST
TECHNIQUE: Multidetector CT imaging of the abdomen and pelvis was performed
following the standard protocol without IV contrast.

[Series 2: stone standard full · axial · 0.96mm/px · z∈[-583,-188]mm · 14 of 87 slices shown, 16 images]
[im 4/87  soft-tissue]
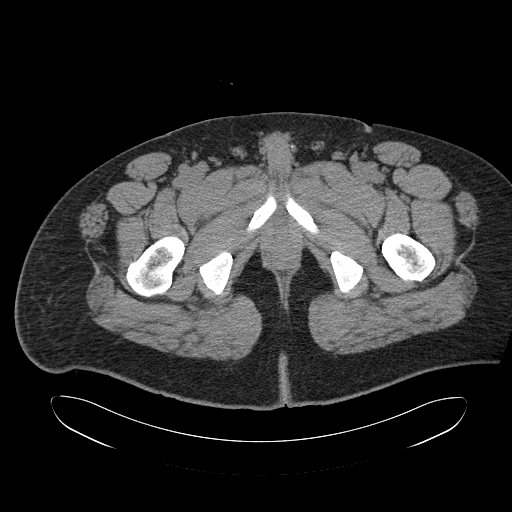
[im 4/87  bone]
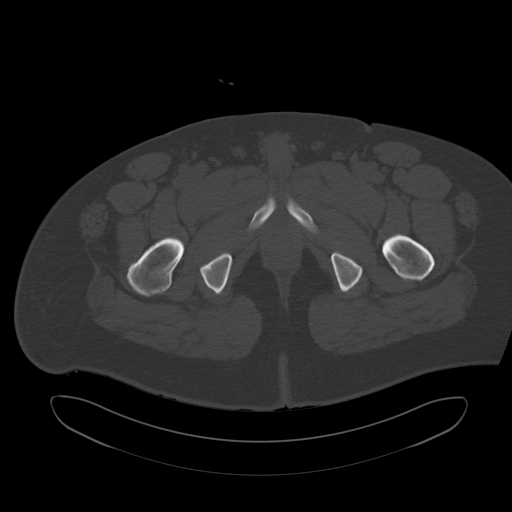
[im 10/87  soft-tissue]
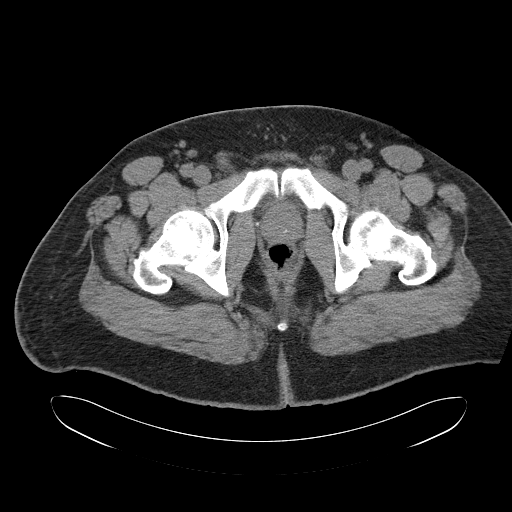
[im 17/87  soft-tissue]
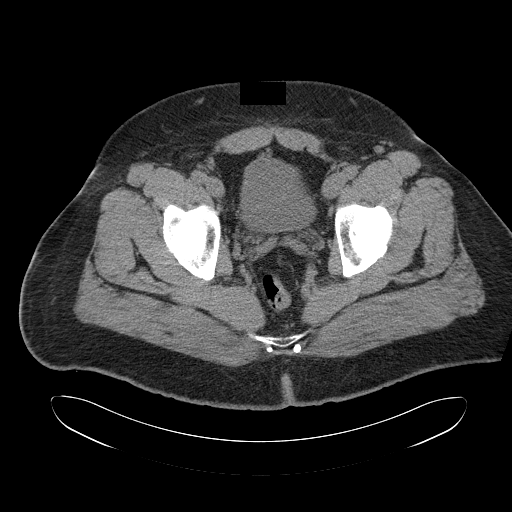
[im 24/87  soft-tissue]
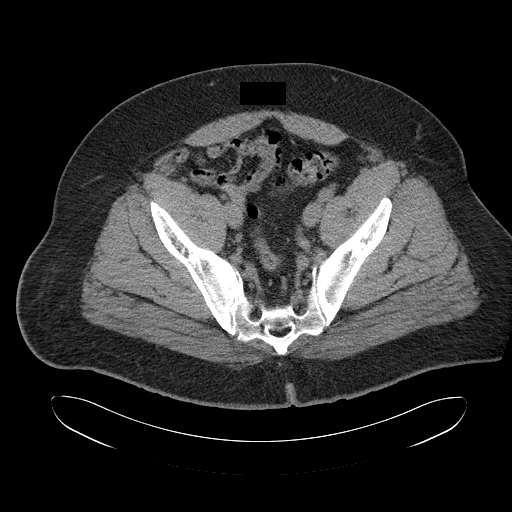
[im 30/87  soft-tissue]
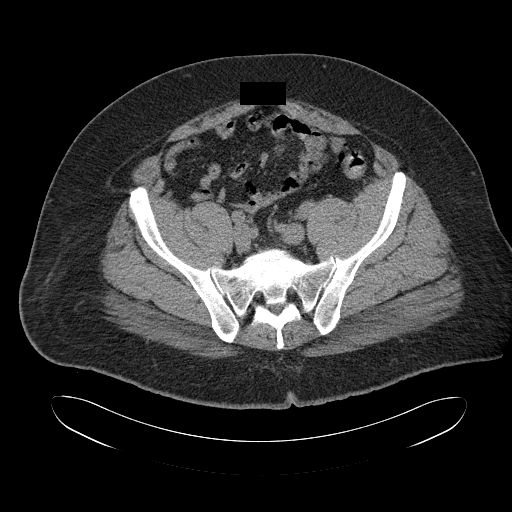
[im 34/87  soft-tissue]
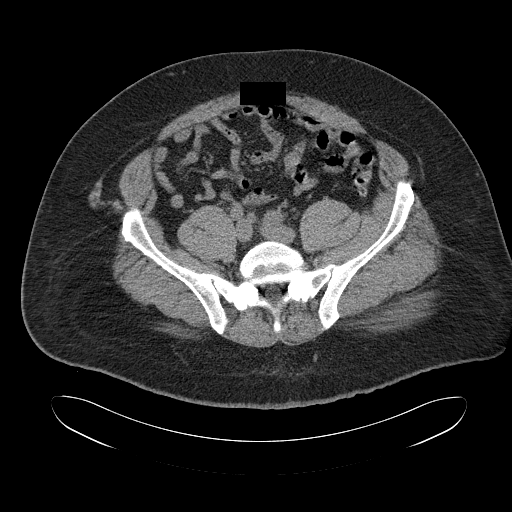
[im 40/87  soft-tissue]
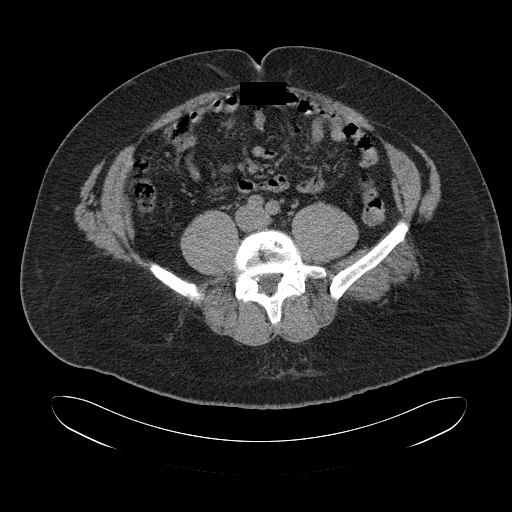
[im 47/87  soft-tissue]
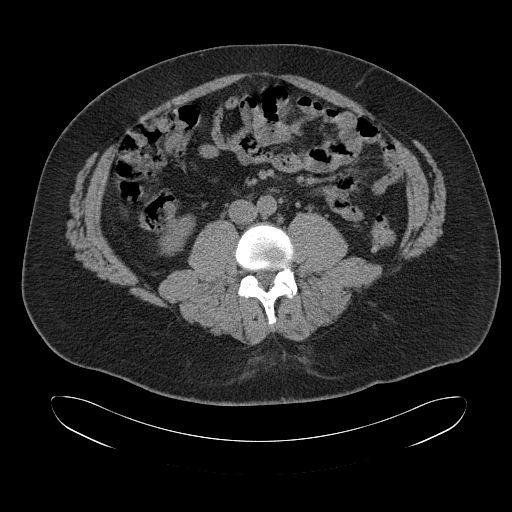
[im 53/87  soft-tissue]
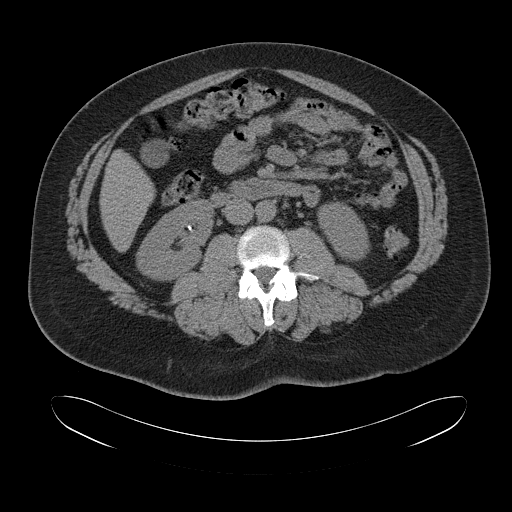
[im 53/87  bone]
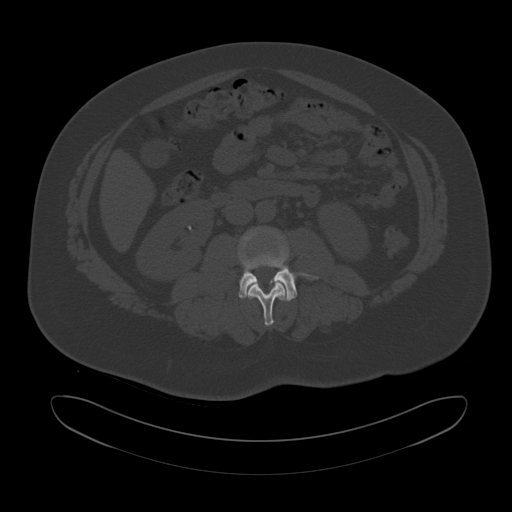
[im 57/87  soft-tissue]
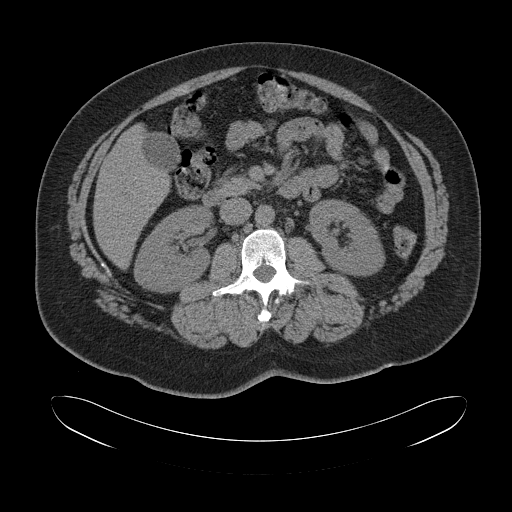
[im 63/87  soft-tissue]
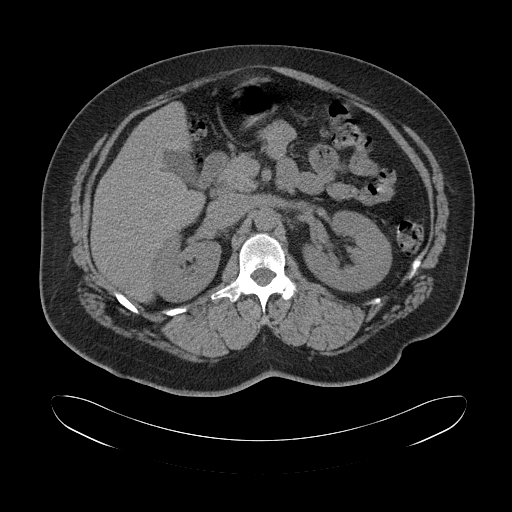
[im 70/87  soft-tissue]
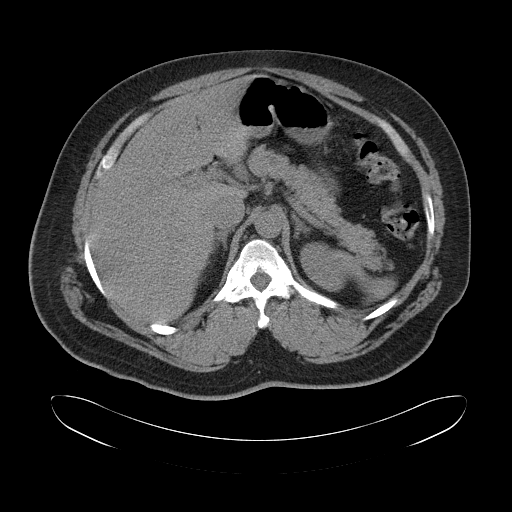
[im 77/87  soft-tissue]
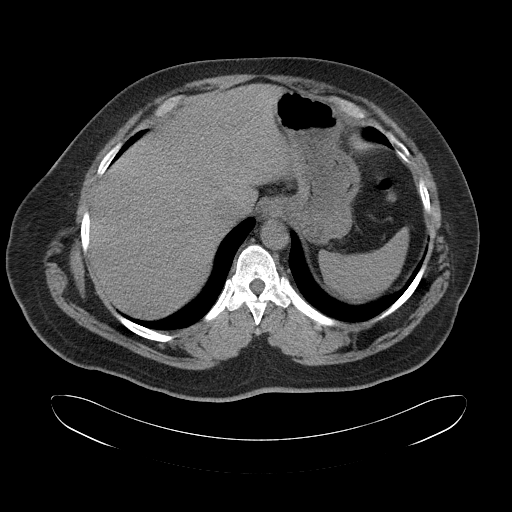
[im 83/87  soft-tissue]
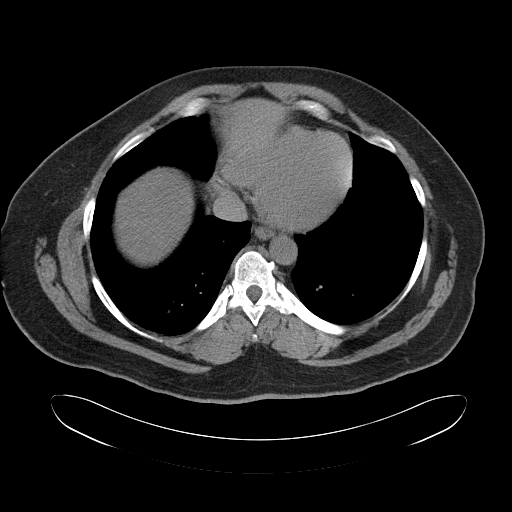

[Series 5: cor stone standard full · coronal · 0.92mm/px · 3 of 165 slices shown]
[im 55/165  soft-tissue]
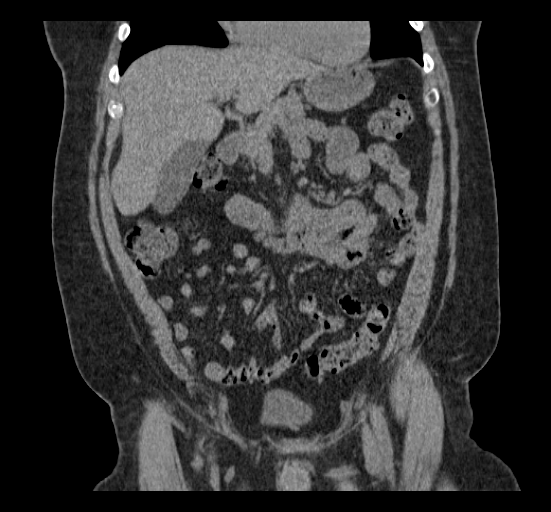
[im 73/165  soft-tissue]
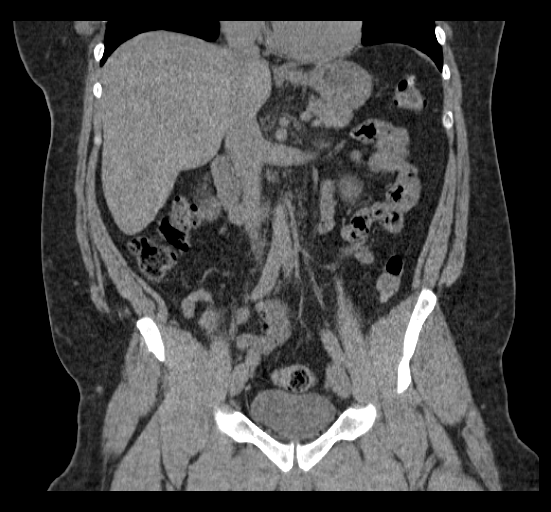
[im 92/165  soft-tissue]
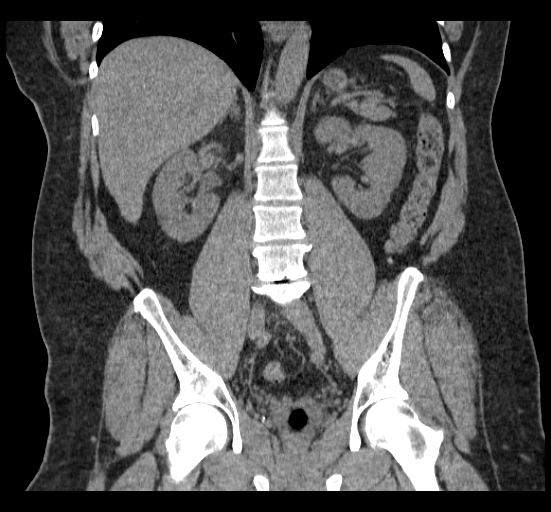

[17 of 46 positions shown; findings below may reference images not displayed]

FINDINGS: Lung bases show no acute findings. Heart size normal. No pericardial
or pleural effusion.

Liver, gallbladder and adrenal glands are unremarkable. Stones are
seen in the kidneys bilaterally, right greater than left. No
ureteral calculi or obstruction. Spleen, pancreas, stomach and bowel
are unremarkable. No pathologically enlarged lymph nodes. Prostate
and bladder are unremarkable. No worrisome lytic or sclerotic
lesions. Degenerative changes are scattered in the spine.
IMPRESSION: Bilateral renal stones without obstruction or other acute finding.

## 2015-09-05 ENCOUNTER — Other Ambulatory Visit
Admission: RE | Admit: 2015-09-05 | Discharge: 2015-09-05 | Disposition: A | Discharge status: Home / Self Care | Payer: Medicare Other | Source: Ambulatory Visit | Attending: Family Medicine | Admitting: Family Medicine

## 2015-09-05 DIAGNOSIS — M109 Gout, unspecified: Secondary | ICD-10-CM | POA: Diagnosis present

## 2015-09-05 LAB — SYNOVIAL FLUID, CRYSTAL

## 2015-09-09 LAB — BODY FLUID CULTURE: Culture: NO GROWTH

## 2017-05-26 ENCOUNTER — Emergency Department
Admission: EM | Admit: 2017-05-26 | Discharge: 2017-05-26 | Disposition: A | Discharge status: Home / Self Care | Payer: Medicare Other | Attending: Emergency Medicine | Admitting: Emergency Medicine

## 2017-05-26 ENCOUNTER — Encounter: Payer: Self-pay | Admitting: Emergency Medicine

## 2017-05-26 DIAGNOSIS — H6121 Impacted cerumen, right ear: Secondary | ICD-10-CM | POA: Insufficient documentation

## 2017-05-26 DIAGNOSIS — Z79899 Other long term (current) drug therapy: Secondary | ICD-10-CM | POA: Insufficient documentation

## 2017-05-26 DIAGNOSIS — I10 Essential (primary) hypertension: Secondary | ICD-10-CM | POA: Diagnosis not present

## 2017-05-26 DIAGNOSIS — H9201 Otalgia, right ear: Secondary | ICD-10-CM | POA: Diagnosis present

## 2017-05-26 DIAGNOSIS — Z87891 Personal history of nicotine dependence: Secondary | ICD-10-CM | POA: Diagnosis not present

## 2017-05-26 MED ORDER — CARBAMIDE PEROXIDE 6.5 % OT SOLN
OTIC | Status: AC
  Administered 2017-05-26: 5 [drp] via OTIC
  Filled 2017-05-26: qty 15

## 2017-05-26 MED ORDER — CARBAMIDE PEROXIDE 6.5 % OT SOLN
5.0000 [drp] | Freq: Once | OTIC | Status: AC
  Administered 2017-05-26: 5 [drp] via OTIC

## 2017-05-26 NOTE — ED Triage Notes (Signed)
Pt c/o right ear pain last night and feeling like something stuck in it. Used qtip and then had some difficulty hearing out of that ear. No drainage or fevers.  Ambulatory to triage.

## 2017-05-26 NOTE — ED Provider Notes (Signed)
[facility] Emergency Department Provider Note   ____________________________________________   First MD Initiated Contact with Patient 05/26/17 [phone]     (approximate)  I have reviewed the triage vital signs and the nursing notes.   HISTORY  Chief Complaint Otalgia    HPI Bernard Perez is a 46 y.o. male is a complaint of right ear pain. Patient states that last evening he got some soap in his ear then used a Q-tip to get it out and since that time has decreased hearing and pain. Patient denies any fever or chills. He denies any recent ear infections. He denies swimming.Patient is hypertensive and did not take the 2 medications that he takes this morning because this concern of his ear.     Past Medical History:  Diagnosis Date  . Gout   . Hypertension   . Kidney stones     There are no active problems to display for this patient.   Past Surgical History:  Procedure Laterality Date  . ANKLE FUSION    . TONSILLECTOMY      Prior to Admission medications   Medication Sig Start Date End Date Taking? Authorizing Provider  ondansetron (ZOFRAN) 4 MG tablet Take 1 tablet (4 mg total) by mouth daily as needed for nausea or vomiting. 08/30/15   Phineas SemenGoodman, Graydon, MD  oxyCODONE-acetaminophen (ROXICET) 5-325 MG tablet Take 1 tablet by mouth every 4 (four) hours as needed for severe pain. 08/30/15   Phineas SemenGoodman, Graydon, MD  predniSONE (DELTASONE) 10 MG tablet Days 1-4: Take 40 mg (4 tablets) orally daily Days 5-7: Take 30 mg (3 tablets) orally daily Days 8-10: Take 20 mg (2 tablets) orally daily Days 11-13: Take 10 mg (1 tablet) orally daily 08/30/15   Phineas SemenGoodman, Graydon, MD  tamsulosin (FLOMAX) 0.4 MG CAPS capsule Take 1 capsule (0.4 mg total) by mouth daily. 08/30/15   Phineas SemenGoodman, Graydon, MD    Allergies Patient has no known allergies.  History reviewed. No pertinent family history.  Social History Social History  Substance Use Topics  . Smoking status:  Former Games developermoker  . Smokeless tobacco: Not on file  . Alcohol use No    ___________________________________________   PHYSICAL EXAM:  VITAL SIGNS: ED Triage Vitals  Enc Vitals Group     BP 05/26/17 0840 (!) 203/103     Pulse Rate 05/26/17 0839 78     Resp 05/26/17 0839 18     Temp 05/26/17 0839 98.6 F (37 C)     Temp Source 05/26/17 0839 Oral     SpO2 05/26/17 0839 96 %     Weight 05/26/17 0839 275 lb (124.7 kg)     Height 05/26/17 0839 6\' 1"  (1.854 m)     Head Circumference --      Peak Flow --      Pain Score --      Pain Loc --      Pain Edu? --      Excl. in GC? --     Constitutional: Alert and oriented. Well appearing and in no distress. Head: Normocephalic and atraumatic. Ears: Left canal is clear. Right canal has small amount of soap along with moderate amount of cerumen. TM is not visible. Nose: No congestion/rhinorrhea/epistaxis. Neck: No stridor Hematological/Lymphatic/Immunological: No cervical lymphadenopathy. Cardiovascular: Normal rate, regular rhythm. Normal distal pulses. Respiratory: Normal respiratory effort. No wheezes/rales/rhonchi. Neurologic:  Normal gait without ataxia. Normal speech and language.  Skin:  Skin is warm, dry and intact. No rash noted. ____________________________________________  PROCEDURES  Procedure(s) performed: Ear lavage by provider  ____________________________________________   INITIAL IMPRESSION / ASSESSMENT AND PLAN / ED COURSE  Pertinent labs & imaging results that were available during my care of the patient were reviewed by me and considered in my medical decision making (see chart for details).  Cerumen was removed and patient was able to hear without any difficulties. He was given instructions not to use Q-tips. He is follow-up with his PCP if any continued problems. He is also strongly encouraged to take his blood pressure medication every day since he did not take his blood pressure today. He was made aware  that his blood pressure was during his ED visit.     ____________________________________________   FINAL CLINICAL IMPRESSION(S) / ED DIAGNOSES  Final diagnoses:  Impacted cerumen of right ear  Essential hypertension     Tommi Rumps, PA-C 05/26/17 1119    Sharyn Creamer, MD 05/26/17 1309

## 2017-05-26 NOTE — Discharge Instructions (Signed)
Take your blood pressure medication daily.  The blood pressure without medication was 203/103 Follow up with your primary care doctor for recheck of your blood pressure. Obtain Debrox over-the-counter and dropped in the ear when you believe they there is wax in your ear. Do not use Q-tips. Use a bulb syringe to irrigate your ear if needed.

## 2017-05-26 NOTE — ED Triage Notes (Signed)
Pt reports BP runs high. Denies sx to BP.  Has not taken meds today. Ok per Goldman Sachsschaevitz for flex.

## 2018-01-21 ENCOUNTER — Encounter: Payer: Self-pay | Admitting: Emergency Medicine

## 2018-01-21 ENCOUNTER — Other Ambulatory Visit: Payer: Self-pay

## 2018-01-21 ENCOUNTER — Emergency Department
Admission: EM | Admit: 2018-01-21 | Discharge: 2018-01-21 | Disposition: A | Discharge status: Home / Self Care | Payer: Medicare Other | Attending: Emergency Medicine | Admitting: Emergency Medicine

## 2018-01-21 DIAGNOSIS — I10 Essential (primary) hypertension: Secondary | ICD-10-CM

## 2018-01-21 DIAGNOSIS — Z87891 Personal history of nicotine dependence: Secondary | ICD-10-CM | POA: Diagnosis not present

## 2018-01-21 DIAGNOSIS — Z79899 Other long term (current) drug therapy: Secondary | ICD-10-CM | POA: Insufficient documentation

## 2018-01-21 DIAGNOSIS — M79641 Pain in right hand: Secondary | ICD-10-CM | POA: Diagnosis present

## 2018-01-21 DIAGNOSIS — M109 Gout, unspecified: Secondary | ICD-10-CM

## 2018-01-21 DIAGNOSIS — M10031 Idiopathic gout, right wrist: Secondary | ICD-10-CM | POA: Insufficient documentation

## 2018-01-21 MED ORDER — COLCHICINE 0.6 MG PO TABS
1.2000 mg | ORAL_TABLET | Freq: Once | ORAL | Status: AC
  Administered 2018-01-21: 1.2 mg via ORAL
  Filled 2018-01-21: qty 2

## 2018-01-21 MED ORDER — COLCHICINE 0.6 MG PO TABS
ORAL_TABLET | ORAL | Status: AC
  Filled 2018-01-21: qty 2

## 2018-01-21 MED ORDER — METHYLPREDNISOLONE ACETATE 40 MG/ML IJ SUSP
10.0000 mg | Freq: Once | INTRAMUSCULAR | Status: DC
  Filled 2018-01-21 (×3): qty 1

## 2018-01-21 MED ORDER — METHYLPREDNISOLONE SODIUM SUCC 125 MG IJ SOLR
125.0000 mg | Freq: Once | INTRAMUSCULAR | Status: DC
  Filled 2018-01-21: qty 2

## 2018-01-21 MED ORDER — OXYCODONE HCL 5 MG PO TABS
5.0000 mg | ORAL_TABLET | Freq: Once | ORAL | Status: AC
  Administered 2018-01-21: 5 mg via ORAL
  Filled 2018-01-21: qty 1

## 2018-01-21 MED ORDER — CLONIDINE HCL 0.1 MG PO TABS
0.2000 mg | ORAL_TABLET | Freq: Once | ORAL | Status: AC
Start: 2018-01-21 — End: 2018-01-21
  Administered 2018-01-21: 0.2 mg via ORAL
  Filled 2018-01-21: qty 2

## 2018-01-21 MED ORDER — OXYCODONE-ACETAMINOPHEN 10-325 MG PO TABS: 1.0000 | ORAL_TABLET | Freq: Four times a day (QID) | ORAL | 0 refills | Status: AC | PRN

## 2018-01-21 MED ORDER — LIDOCAINE HCL (PF) 1 % IJ SOLN
5.0000 mL | Freq: Once | INTRAMUSCULAR | Status: DC
  Filled 2018-01-21: qty 5

## 2018-01-21 NOTE — ED Notes (Signed)
Pt refuse IM meds  Requesting to speak to provider

## 2018-01-21 NOTE — ED Notes (Signed)
Pt verbalized understanding of discharge instructions. NAD at this time. 

## 2018-01-21 NOTE — ED Provider Notes (Addendum)
[facility] Emergency Department Provider Note  ____________________________________________  Time seen: Approximately 12:55 PM  I have reviewed the triage vital signs and the nursing notes.   HISTORY  Chief Complaint Hand Pain   HPI Bernard Perez is a 47 y.o. male who presents to the emergency department for evaluation treatment of right hand pain and swelling secondary to acute gout flare.  He called the orthopedist that he typically sees, however he is out of town.  The other provider in the office was not comfortable prescribing the prednisone and told him to just take 50 mg of indomethacin.  He states he has been doing this for the past several days without any relief of the symptoms.  Past Medical History:  Diagnosis Date  . Gout   . Hypertension   . Kidney stones     There are no active problems to display for this patient.   Past Surgical History:  Procedure Laterality Date  . ANKLE FUSION    . TONSILLECTOMY      Prior to Admission medications   Medication Sig Start Date End Date Taking? Authorizing Provider  ondansetron (ZOFRAN) 4 MG tablet Take 1 tablet (4 mg total) by mouth daily as needed for nausea or vomiting. 08/30/15   Phineas Semen, MD  oxyCODONE-acetaminophen (PERCOCET) 10-325 MG tablet Take 1 tablet by mouth every 6 (six) hours as needed for pain. 01/21/18 01/21/19  Dahna Hattabaugh, Rulon Eisenmenger B, FNP  predniSONE (DELTASONE) 10 MG tablet Days 1-4: Take 40 mg (4 tablets) orally daily Days 5-7: Take 30 mg (3 tablets) orally daily Days 8-10: Take 20 mg (2 tablets) orally daily Days 11-13: Take 10 mg (1 tablet) orally daily 08/30/15   Phineas Semen, MD  tamsulosin (FLOMAX) 0.4 MG CAPS capsule Take 1 capsule (0.4 mg total) by mouth daily. 08/30/15   Phineas Semen, MD    Allergies Patient has no known allergies.  No family history on file.  Social History Social History   Tobacco Use  . Smoking status: Former Games developer  . Smokeless  tobacco: Never Used  Substance Use Topics  . Alcohol use: No  . Drug use: Not on file    Review of Systems Constitutional: Negative for fever/chills ENT: Negative for sore throat. Cardiovascular: Denies chest pain. Respiratory: Negative for shortness of breath.  Negative for cough. Gastrointestinal: Negative for nausea, no vomiting.  Negative for diarrhea.  Musculoskeletal: Positive for right wrist and hand pain. Skin: Negative for rash. Neurological: Negative for headaches ____________________________________________   PHYSICAL EXAM:  VITAL SIGNS: ED Triage Vitals  Enc Vitals Group     BP 01/21/18 1037 (!) 218/115     Pulse Rate 01/21/18 1037 71     Resp 01/21/18 1037 20     Temp 01/21/18 1037 97.8 F (36.6 C)     Temp Source 01/21/18 1037 Oral     SpO2 01/21/18 1037 98 %     Weight 01/21/18 1038 275 lb (124.7 kg)     Height --      Head Circumference --      Peak Flow --      Pain Score 01/21/18 1037 10     Pain Loc --      Pain Edu? --      Excl. in GC? --     Constitutional: Alert and oriented. Uncomfortable appearing and in no acute distress. Eyes: Conjunctivae are normal. EOMI. Ears: TM normal Nose: No congestion noted; no rhinnorhea. Mouth/Throat: Mucous membranes are moist. Voice is  clear. Neck: No stridor.  Lymphatic: No cervical lymphadenopathy. Cardiovascular: Normal rate, regular rhythm. Good peripheral circulation. Respiratory: Normal respiratory effort.  No retractions. Gastrointestinal: Soft and nontender.  Musculoskeletal: right wrist and hand mildly erythematous, swollen and painful Neurologic:  Normal speech and language.  Skin:  Skin is warm, dry and intact. No rash noted. Psychiatric: Mood and affect are normal. Speech and behavior are normal.  ____________________________________________   LABS (all labs ordered are listed, but only abnormal results are displayed)  Labs Reviewed - No data to  display ____________________________________________  EKG  Not indicated. ____________________________________________  RADIOLOGY  Not indicated. ____________________________________________   PROCEDURES  Procedure(s) performed:  Intra-articular joint injection:  Permission received from the patient who was given warning of pain, infection, and steroid flare. Procedure performed using sterile technique. Skin cleansed with chlorhexidine, Betadine, and sterile normal saline. Radiocarpal joint injected with 10mg  of Depomedrol and 3ml of xylocaine 1% solution. Placement confirmed by anatomical visualization and palpation into the joint space.  Patient tolerated the procedure well without immediate complication.  Critical Care performed: No ____________________________________________   INITIAL IMPRESSION / ASSESSMENT AND PLAN / ED COURSE  47 y.o. male presents to the emergency department for treatment and evaluation of gout flare in the right hand and wrist.  Symptoms and exam are consistent with previous flares.  He states that when it gets this bad, the only thing that helps is a steroid injection into the joint.  While in the emergency department, he was given colchicine and Roxicet which provided little to no relief.  Patient requested that we do the intra-articular injection as he is unable to see his orthopedic provider or rheumatology this week. Intra-articular injection was performed.  Patient tolerated the procedure well.  He verbalized plans to go ahead and schedule an appointment with his rheumatologist for follow-up within the next couple of weeks.  Patient was also advised that he needs to see his primary care provider because of his hypertension.  Patient states that the blood pressure readings here today have been "normal" for him.  He was advised that this puts him at great risk for multiple chronic illnesses and may lead to his death if he does not become compliant  with lifestyle changes and medications.  He was encouraged to return to the ER for symptoms that change or worsen if unable to schedule an appointment.  Medications  colchicine 0.6 MG tablet (  Not Given 01/21/18 1243)  methylPREDNISolone sodium succinate (SOLU-MEDROL) 125 mg/2 mL injection 125 mg (125 mg Intramuscular Not Given 01/21/18 1342)  methylPREDNISolone acetate (DEPO-MEDROL) injection 10 mg (not administered)  lidocaine (PF) (XYLOCAINE) 1 % injection 5 mL (not administered)  cloNIDine (CATAPRES) tablet 0.2 mg (0.2 mg Oral Given 01/21/18 1150)  colchicine tablet 1.2 mg (1.2 mg Oral Given 01/21/18 1149)  oxyCODONE (Oxy IR/ROXICODONE) immediate release tablet 5 mg (5 mg Oral Given 01/21/18 1150)    ED Discharge Orders        Ordered    oxyCODONE-acetaminophen (PERCOCET) 10-325 MG tablet  Every 6 hours PRN     01/21/18 1437       Pertinent labs & imaging results that were available during my care of the patient were reviewed by me and considered in my medical decision making (see chart for details).    If controlled substance prescribed during this visit, 12 month history viewed on the [facility] prior to issuing an initial prescription for Schedule II or III opiod. ____________________________________________   FINAL CLINICAL IMPRESSION(S) /  ED DIAGNOSES  Final diagnoses:  Acute gout of right wrist, unspecified cause  Hypertension, unspecified type    Note:  This document was prepared using Dragon voice recognition software and may include unintentional dictation errors.     Chinita Pester, FNP 01/21/18 1522    Chinita Pester, FNP 01/21/18 1525    Jene Every, MD 01/24/18 [phone]

## 2018-01-21 NOTE — ED Triage Notes (Addendum)
Patient to ER for c/o swelling to right hand/wrist. Patient has h/o gout, feels similar to previous episodes. Has been on Indomethacin since Saturday without improvement.  Patient hypertensive in triage. Denies any chest pain or dizziness. Patient reports being out of HTN medication x1 month.

## 2018-01-21 NOTE — ED Notes (Addendum)
See triage note  Presents with pain and swelling to right hand and wrist  W/o injury  States it started about 2 weeks ago  Was using Ibu w/o relief  Then spoke with PA and was instructed to use indocin   Stats he has been taking twice a day  Hand is swollen and very tender to touch

## 2018-08-11 ENCOUNTER — Other Ambulatory Visit: Payer: Self-pay

## 2018-08-11 ENCOUNTER — Encounter: Payer: Self-pay | Admitting: Emergency Medicine

## 2018-08-11 ENCOUNTER — Emergency Department
Admission: EM | Admit: 2018-08-11 | Discharge: 2018-08-11 | Disposition: A | Discharge status: Home / Self Care | Payer: Medicare Other | Attending: Emergency Medicine | Admitting: Emergency Medicine

## 2018-08-11 DIAGNOSIS — H538 Other visual disturbances: Secondary | ICD-10-CM | POA: Insufficient documentation

## 2018-08-11 DIAGNOSIS — Z87891 Personal history of nicotine dependence: Secondary | ICD-10-CM | POA: Diagnosis not present

## 2018-08-11 DIAGNOSIS — I10 Essential (primary) hypertension: Secondary | ICD-10-CM | POA: Diagnosis not present

## 2018-08-11 DIAGNOSIS — Z79899 Other long term (current) drug therapy: Secondary | ICD-10-CM | POA: Diagnosis not present

## 2018-08-11 DIAGNOSIS — H5713 Ocular pain, bilateral: Secondary | ICD-10-CM | POA: Diagnosis not present

## 2018-08-11 DIAGNOSIS — R42 Dizziness and giddiness: Secondary | ICD-10-CM | POA: Diagnosis present

## 2018-08-11 LAB — URINALYSIS, COMPLETE (UACMP) WITH MICROSCOPIC
BILIRUBIN URINE: NEGATIVE
Bacteria, UA: NONE SEEN
GLUCOSE, UA: NEGATIVE mg/dL
Ketones, ur: NEGATIVE mg/dL
LEUKOCYTES UA: NEGATIVE
NITRITE: NEGATIVE
PH: 5 (ref 5.0–8.0)
Protein, ur: 30 mg/dL — AB
SPECIFIC GRAVITY, URINE: 1.019 (ref 1.005–1.030)

## 2018-08-11 LAB — CBC
HCT: 49.9 % (ref 40.0–52.0)
Hemoglobin: 16.9 g/dL (ref 13.0–18.0)
MCH: 28.3 pg (ref 26.0–34.0)
MCHC: 33.9 g/dL (ref 32.0–36.0)
MCV: 83.4 fL (ref 80.0–100.0)
PLATELETS: 226 10*3/uL (ref 150–440)
RBC: 5.98 MIL/uL — AB (ref 4.40–5.90)
RDW: 15.4 % — AB (ref 11.5–14.5)
WBC: 7.3 10*3/uL (ref 3.8–10.6)

## 2018-08-11 LAB — BASIC METABOLIC PANEL
Anion gap: 11 (ref 5–15)
BUN: 11 mg/dL (ref 6–20)
CALCIUM: 9.4 mg/dL (ref 8.9–10.3)
CO2: 25 mmol/L (ref 22–32)
CREATININE: 0.85 mg/dL (ref 0.61–1.24)
Chloride: 104 mmol/L (ref 98–111)
Glucose, Bld: 171 mg/dL — ABNORMAL HIGH (ref 70–99)
Potassium: 3.8 mmol/L (ref 3.5–5.1)
SODIUM: 140 mmol/L (ref 135–145)

## 2018-08-11 MED ORDER — AMLODIPINE BESYLATE 5 MG PO TABS
ORAL_TABLET | ORAL | Status: AC
  Filled 2018-08-11: qty 1

## 2018-08-11 MED ORDER — AMLODIPINE BESYLATE 5 MG PO TABS: 5.0000 mg | ORAL_TABLET | Freq: Every day | ORAL | 1 refills | Status: DC

## 2018-08-11 MED ORDER — FLUORESCEIN SODIUM 1 MG OP STRP
1.0000 | ORAL_STRIP | Freq: Once | OPHTHALMIC | Status: AC
  Administered 2018-08-11: 1 via OPHTHALMIC
  Filled 2018-08-11: qty 1

## 2018-08-11 MED ORDER — TETRACAINE HCL 0.5 % OP SOLN
1.0000 [drp] | Freq: Once | OPHTHALMIC | Status: AC
  Administered 2018-08-11: 1 [drp] via OPHTHALMIC
  Filled 2018-08-11: qty 4

## 2018-08-11 MED ORDER — AMLODIPINE BESYLATE 5 MG PO TABS
5.0000 mg | ORAL_TABLET | Freq: Once | ORAL | Status: AC
  Administered 2018-08-11: 5 mg via ORAL

## 2018-08-11 NOTE — ED Provider Notes (Signed)
[facility] Emergency Department Provider Note   ____________________________________________   I have reviewed the triage vital signs and the nursing notes.   HISTORY  Chief Complaint Dizziness and Hypertension   History limited by: Not Limited   HPI Bernard Perez is a 47 y.o. male who presents to the emergency department today with concerns for headache, eye pain, blurry vision and high blood pressure.  Patient states that he first started feeling bad a couple of days ago.  He started noticing some pain around his right eye.  He then developed some blurry vision.  He started feeling weak.  He did find that his blood pressure was elevated.  He states he has a history of high blood pressure and has been on blood pressure medications in the past.  Currently is not on any blood pressure medications.  He has not followed up with his primary care doctor.  Patient does not recall the name of the blood pressure medication is supposed to be on.   Per medical record review patient has a history of gout, kidney stones, hypertension.  Past Medical History:  Diagnosis Date  . Gout   . Hypertension   . Kidney stones     There are no active problems to display for this patient.   Past Surgical History:  Procedure Laterality Date  . ANKLE FUSION    . TONSILLECTOMY      Prior to Admission medications   Medication Sig Start Date End Date Taking? Authorizing Provider  ondansetron (ZOFRAN) 4 MG tablet Take 1 tablet (4 mg total) by mouth daily as needed for nausea or vomiting. 08/30/15   Phineas Semen, MD  oxyCODONE-acetaminophen (PERCOCET) 10-325 MG tablet Take 1 tablet by mouth every 6 (six) hours as needed for pain. 01/21/18 01/21/19  Triplett, Rulon Eisenmenger B, FNP  predniSONE (DELTASONE) 10 MG tablet Days 1-4: Take 40 mg (4 tablets) orally daily Days 5-7: Take 30 mg (3 tablets) orally daily Days 8-10: Take 20 mg (2 tablets) orally daily Days 11-13: Take 10 mg (1  tablet) orally daily 08/30/15   Phineas Semen, MD  tamsulosin (FLOMAX) 0.4 MG CAPS capsule Take 1 capsule (0.4 mg total) by mouth daily. 08/30/15   Phineas Semen, MD    Allergies Patient has no known allergies.  No family history on file.  Social History Social History   Tobacco Use  . Smoking status: Former Games developer  . Smokeless tobacco: Never Used  Substance Use Topics  . Alcohol use: No  . Drug use: Not on file    Review of Systems Constitutional: No fever/chills Eyes: Positive for right eye pain and blurry vision. ENT: No sore throat. Cardiovascular: Denies chest pain. Respiratory: Denies shortness of breath. Gastrointestinal: No abdominal pain.  No nausea, no vomiting.  No diarrhea.   Genitourinary: Negative for dysuria. Musculoskeletal: Negative for back pain. Skin: Negative for rash. Neurological: Positive for headache.  ____________________________________________   PHYSICAL EXAM:  VITAL SIGNS: ED Triage Vitals  Enc Vitals Group     BP 08/11/18 1555 (!) 206/106     Pulse Rate 08/11/18 1555 69     Resp 08/11/18 1555 20     Temp 08/11/18 1555 98.4 F (36.9 C)     Temp Source 08/11/18 1555 Oral     SpO2 08/11/18 1555 97 %     Weight 08/11/18 1555 265 lb (120.2 kg)     Height 08/11/18 1555 6\' 1"  (1.854 m)     Head Circumference --  Peak Flow --      Pain Score 08/11/18 1559 0   Constitutional: Alert and oriented.  Eyes: Right eye with some injected conjunctiva.  ENT      Head: Normocephalic and atraumatic.      Nose: No congestion/rhinnorhea.      Mouth/Throat: Mucous membranes are moist.      Neck: No stridor. Hematological/Lymphatic/Immunilogical: No cervical lymphadenopathy. Cardiovascular: Normal rate, regular rhythm.  No murmurs, rubs, or gallops.  Respiratory: Normal respiratory effort without tachypnea nor retractions. Breath sounds are clear and equal bilaterally. No wheezes/rales/rhonchi. Gastrointestinal: Soft and non tender. No  rebound. No guarding.  Genitourinary: Deferred Musculoskeletal: Normal range of motion in all extremities. No lower extremity edema. Neurologic:  Normal speech and language. No gross focal neurologic deficits are appreciated.  Skin:  Skin is warm, dry and intact. No rash noted. Psychiatric: Mood and affect are normal. Speech and behavior are normal. Patient exhibits appropriate insight and judgment.  ____________________________________________    LABS (pertinent positives/negatives)  CBC wbc 7.3, hgb 16.9, plt 226 BMP wnl except glu 171 UA small hgb dipstick, protein 30, 0-5 wbc and rbc ____________________________________________   EKG  None  ____________________________________________    RADIOLOGY  None  ____________________________________________   PROCEDURES  Procedures  ____________________________________________   INITIAL IMPRESSION / ASSESSMENT AND PLAN / ED COURSE  Pertinent labs & imaging results that were available during my care of the patient were reviewed by me and considered in my medical decision making (see chart for details).   Presented to the emergency department today with complaints centered around high blood pressure.  Patient was found to have elevated blood pressure.Marland Kitchen  His right eye did have some injection.  However on fluorescein stain no corneal abrasion.  Intraocular pressure 15.  This point I do think likely related to blood pressure issues.  Will start patient on blood pressure medications.  Will give patient follow-up information.  ____________________________________________   FINAL CLINICAL IMPRESSION(S) / ED DIAGNOSES  Final diagnoses:  Hypertension, unspecified type     Note: This dictation was prepared with Dragon dictation. Any transcriptional errors that result from this process are unintentional     Phineas Semen, MD 08/11/18 [phone]

## 2018-08-11 NOTE — Discharge Instructions (Signed)
Please seek medical attention for any high fevers, chest pain, shortness of breath, change in behavior, persistent vomiting, bloody stool or any other new or concerning symptoms.  

## 2018-08-11 NOTE — ED Triage Notes (Signed)
Pt arrived via POV with family with reports of dizziness, blurred vision and elevated BP for about 2 days. Pt not currently taking any medications for BP, states he feels "drunk"

## 2018-08-11 NOTE — ED Notes (Signed)
Esign not working at this time. Pt verbalized discharge instructions and has no questions at this time. 

## 2018-08-11 NOTE — ED Notes (Signed)
Visual Acuity Right eye 20/25 Left eye 20/25 Both 20/25

## 2018-09-05 ENCOUNTER — Other Ambulatory Visit: Payer: Self-pay | Admitting: Nurse Practitioner

## 2018-09-05 DIAGNOSIS — H538 Other visual disturbances: Secondary | ICD-10-CM

## 2018-09-16 ENCOUNTER — Ambulatory Visit
Admission: RE | Admit: 2018-09-16 | Discharge: 2018-09-16 | Disposition: A | Discharge status: Home / Self Care | Payer: Medicare Other | Source: Ambulatory Visit | Attending: Nurse Practitioner | Admitting: Nurse Practitioner

## 2018-09-16 DIAGNOSIS — H538 Other visual disturbances: Secondary | ICD-10-CM | POA: Insufficient documentation

## 2018-09-16 MED ORDER — IOPAMIDOL (ISOVUE-300) INJECTION 61%
75.0000 mL | Freq: Once | INTRAVENOUS | Status: AC | PRN
  Administered 2018-09-16: 75 mL via INTRAVENOUS

## 2020-12-16 ENCOUNTER — Encounter: Payer: Self-pay | Admitting: Emergency Medicine

## 2020-12-16 ENCOUNTER — Emergency Department: Payer: Medicare Other

## 2020-12-16 ENCOUNTER — Emergency Department
Admission: EM | Admit: 2020-12-16 | Discharge: 2020-12-16 | Disposition: A | Discharge status: Home / Self Care | Payer: Medicare Other | Attending: Emergency Medicine | Admitting: Emergency Medicine

## 2020-12-16 ENCOUNTER — Other Ambulatory Visit: Payer: Self-pay

## 2020-12-16 DIAGNOSIS — M545 Low back pain, unspecified: Secondary | ICD-10-CM | POA: Insufficient documentation

## 2020-12-16 DIAGNOSIS — Z79899 Other long term (current) drug therapy: Secondary | ICD-10-CM | POA: Insufficient documentation

## 2020-12-16 DIAGNOSIS — Z87891 Personal history of nicotine dependence: Secondary | ICD-10-CM | POA: Insufficient documentation

## 2020-12-16 DIAGNOSIS — Y92410 Unspecified street and highway as the place of occurrence of the external cause: Secondary | ICD-10-CM | POA: Insufficient documentation

## 2020-12-16 DIAGNOSIS — I10 Essential (primary) hypertension: Secondary | ICD-10-CM | POA: Insufficient documentation

## 2020-12-16 MED ORDER — KETOROLAC TROMETHAMINE 10 MG PO TABS: 10.0000 mg | ORAL_TABLET | Freq: Four times a day (QID) | ORAL | 0 refills | Status: AC | PRN

## 2020-12-16 MED ORDER — METHOCARBAMOL 750 MG PO TABS: 750.0000 mg | ORAL_TABLET | Freq: Four times a day (QID) | ORAL | 0 refills | Status: AC | PRN

## 2020-12-16 MED ORDER — ACETAMINOPHEN 325 MG PO TABS
650.0000 mg | ORAL_TABLET | Freq: Once | ORAL | Status: AC
  Administered 2020-12-16: 650 mg via ORAL
  Filled 2020-12-16: qty 2

## 2020-12-16 MED ORDER — AMLODIPINE BESYLATE 5 MG PO TABS: 5.0000 mg | ORAL_TABLET | Freq: Every day | ORAL | 0 refills | Status: AC

## 2020-12-16 MED ORDER — AMLODIPINE BESYLATE 5 MG PO TABS
5.0000 mg | ORAL_TABLET | Freq: Once | ORAL | Status: AC
  Administered 2020-12-16: 5 mg via ORAL
  Filled 2020-12-16: qty 1

## 2020-12-16 MED ORDER — KETOROLAC TROMETHAMINE 60 MG/2ML IM SOLN
30.0000 mg | Freq: Once | INTRAMUSCULAR | Status: AC
  Administered 2020-12-16: 30 mg via INTRAMUSCULAR
  Filled 2020-12-16: qty 2
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
## 2020-12-16 NOTE — ED Notes (Signed)
Pt denies having chest pain, headache, or blurred vision
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
## 2022-10-05 ENCOUNTER — Emergency Department
Admission: EM | Admit: 2022-10-05 | Discharge: 2022-10-05 | Disposition: A | Discharge status: Home / Self Care | Payer: Medicare HMO | Attending: Emergency Medicine | Admitting: Emergency Medicine

## 2022-10-05 ENCOUNTER — Encounter: Payer: Self-pay | Admitting: Emergency Medicine

## 2022-10-05 ENCOUNTER — Other Ambulatory Visit: Payer: Self-pay

## 2022-10-05 DIAGNOSIS — H11421 Conjunctival edema, right eye: Secondary | ICD-10-CM | POA: Insufficient documentation

## 2022-10-05 DIAGNOSIS — S0501XA Injury of conjunctiva and corneal abrasion without foreign body, right eye, initial encounter: Secondary | ICD-10-CM | POA: Insufficient documentation

## 2022-10-05 DIAGNOSIS — X58XXXA Exposure to other specified factors, initial encounter: Secondary | ICD-10-CM | POA: Insufficient documentation

## 2022-10-05 MED ORDER — FLUORESCEIN SODIUM 1 MG OP STRP
1.0000 | ORAL_STRIP | Freq: Once | OPHTHALMIC | Status: AC
  Administered 2022-10-05: 1 via OPHTHALMIC
  Filled 2022-10-05: qty 1

## 2022-10-05 MED ORDER — TETRACAINE HCL 0.5 % OP SOLN
2.0000 [drp] | Freq: Once | OPHTHALMIC | Status: AC
  Administered 2022-10-05: 2 [drp] via OPHTHALMIC
  Filled 2022-10-05: qty 4

## 2022-10-05 MED ORDER — ERYTHROMYCIN 5 MG/GM OP OINT: 1.0000 | TOPICAL_OINTMENT | Freq: Every day | OPHTHALMIC | 0 refills | Status: AC

## 2022-10-05 NOTE — ED Triage Notes (Signed)
Pt presents via POV with complaints of right eye pain and swelling that started this AM. Visible edema and redness to the sclera. He states feeling like something is in it and be rubbed it which caused the swelling. Does not wear contacts nor glasses. Denies CP or SOB.

## 2022-10-05 NOTE — ED Provider Notes (Signed)
[facility] Provider Note    None    (approximate)   History   Eye Pain   HPI  Bernard Perez is a 51 y.o. male with no reported past medical history presents today for evaluation of right eye irritation.  Patient reports that he woke up with a feeling of foreign body in his eye and rubbed his eye aggressively and vigorously, and now reports that it feels swollen.  He denies any change in his vision.  He denies light sensitivity.  He denies headaches.  He does not wear contact lenses.  No recent fevers or chills.  No discharge.  There are no problems to display for this patient.         Physical Exam   Triage Vital Signs: ED Triage Vitals  Enc Vitals Group     BP 10/05/22 0638 (!) 151/96     Pulse Rate 10/05/22 0638 66     Resp 10/05/22 0638 18     Temp 10/05/22 0638 98.3 F (36.8 C)     Temp Source 10/05/22 0638 Oral     SpO2 10/05/22 0638 98 %     Weight 10/05/22 0640 275 lb (124.7 kg)     Height 10/05/22 0640 6\' 1"  (1.854 m)     Head Circumference --      Peak Flow --      Pain Score 10/05/22 0645 7     Pain Loc --      Pain Edu? --      Excl. in GC? --     Most recent vital signs: Vitals:   10/05/22 0638 10/05/22 0746  BP: (!) 151/96 (!) 148/88  Pulse: 66 68  Resp: 18 18  Temp: 98.3 F (36.8 C)   SpO2: 98% 98%    Physical Exam Vitals and nursing note reviewed.  Constitutional:      General: Awake and alert. No acute distress.    Appearance: Normal appearance. The patient is normal weight.  HENT:     Head: Normocephalic and atraumatic.     Mouth: Mucous membranes are moist.  Eyes:     General: PERRL. Normal EOMs        Right eye: No discharge.        Left eye: No discharge.  Conjunctival injection noted with lateral chemosis.  Small area of fluorescein uptake at the 6 o'clock position.  Negative seatbelt sign.  Normal and full extraocular movements without periorbital edema or erythema.  Normal lids and lashes.  Ocular  pressure 17.  No hyphema or hypopyon    Conjunctiva/sclera: Conjunctivae normal.  Cardiovascular:     Rate and Rhythm: Normal rate and regular rhythm.  Pulmonary:     Effort: Pulmonary effort is normal. No respiratory distress.  Abdominal:     Abdomen is soft. There is no abdominal tenderness. Musculoskeletal:        General: No swelling. Normal range of motion.     Cervical back: Normal range of motion and neck supple.  Skin:    General: Skin is warm and dry.     Capillary Refill: Capillary refill takes less than 2 seconds.     Findings: No rash.  Neurological:     Mental Status: The patient is awake and alert.      ED Results / Procedures / Treatments   Labs (all labs ordered are listed, but only abnormal results are displayed) Labs Reviewed - No data to display   EKG  RADIOLOGY     PROCEDURES:  Critical Care performed:   Procedures   MEDICATIONS ORDERED IN ED: Medications  tetracaine (PONTOCAINE) 0.5 % ophthalmic solution 2 drop (2 drops Right Eye Given by Other 10/05/22 0718)  fluorescein ophthalmic strip 1 strip (1 strip Right Eye Given 10/05/22 0718)     IMPRESSION / MDM / ASSESSMENT AND PLAN / ED COURSE  I reviewed the triage vital signs and the nursing notes.   Differential diagnosis includes, but is not limited to, conjunctivitis, corneal abrasion, chemosis, retained foreign body, acute angle-closure glaucoma, keratitis.  Patient is awake and alert, hemodynamically stable and afebrile.  He denies any changes in his vision.  There is no periorbital erythema or edema, no eyelid edema, not consistent with preseptal cellulitis.  He has full and normal extraocular movements.  He has conjunctival injection with chemosis of the lateral aspect, which I suspect is from his vigorous rubbing of his eye.  He does have a very small corneal abrasion at approximately the 6 o'clock position.  There is no evidence of retained foreign body.  There is negative Seidel  sign, not consistent with open globe.  There is no proptosis.  No evidence of ulceration, no hyphema.  No hypopyon or evidence of endophthalmitis.  He has normal ocular pressure of 17, not consistent with acute angle-closure glaucoma.  He was started on erythromycin ointment and instructed to follow-up with ophthalmology.  We discussed the importance of not rubbing his eye as this will only worsen the ecchymosis.  We discussed strict return precautions and the importance of close outpatient follow-up with ophthalmology.  The appropriate follow-up information was provided.  Patient understands and agrees with plan.  He was discharged in stable condition.   Patient's presentation is most consistent with acute complicated illness / injury requiring diagnostic workup.    FINAL CLINICAL IMPRESSION(S) / ED DIAGNOSES   Final diagnoses:  Abrasion of right cornea, initial encounter  Chemosis of right conjunctiva     Rx / DC Orders   ED Discharge Orders          Ordered    erythromycin ophthalmic ointment  Daily at bedtime        10/05/22 0738             Note:  This document was prepared using Dragon voice recognition software and may include unintentional dictation errors.   Jackelyn Hoehn, PA-C 10/05/22 9449    Concha Se, MD 10/05/22 [phone]

## 2022-10-05 NOTE — Discharge Instructions (Signed)
Use the ointment as prescribed.  Please do not rub your eye as this will make it worse.  May take Tylenol/ibuprofen per package instructions to help with your symptoms.  Please follow-up with the ophthalmologist, call the phone number provided.  Please return for any new, worsening, or change in symptoms or other concerns.  It was a pleasure caring for you today.
